# Patient Record
Sex: Female | Born: 1995 | Race: White | Hispanic: No | Marital: Single | Smoking: Never smoker
Health system: Southern US, Community
[De-identification: ages and names within clinical notes are randomized; demographics above are authoritative.]

## PROBLEM LIST (undated history)

## (undated) DIAGNOSIS — J45909 Unspecified asthma, uncomplicated: Secondary | ICD-10-CM

## (undated) DIAGNOSIS — F419 Anxiety disorder, unspecified: Secondary | ICD-10-CM

## (undated) HISTORY — PX: MOUTH SURGERY: SHX715

## (undated) HISTORY — PX: EYE SURGERY: SHX253

---
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
## 2014-05-05 ENCOUNTER — Ambulatory Visit (INDEPENDENT_AMBULATORY_CARE_PROVIDER_SITE_OTHER): Payer: Medicaid Other | Admitting: Advanced Practice Midwife

## 2014-05-05 VITALS — BP 106/71 | HR 92 | Temp 98.6°F | Wt 129.0 lb

## 2014-05-05 DIAGNOSIS — Z113 Encounter for screening for infections with a predominantly sexual mode of transmission: Secondary | ICD-10-CM

## 2014-05-05 NOTE — Addendum Note (Signed)
Addended by: Odessa FlemingBOHNE, KRISTINA M on: 05/05/2014 03:04 PM   Modules accepted: Orders

## 2014-05-05 NOTE — Progress Notes (Signed)
  Subjective:    Alison Stephenson is a 18 y.o. female who presents for sexually transmitted disease check. Sexual history reviewed with the patient. STI Exposure: denies knowledge of risky exposure and current sexual partner thought to have no history of any STD. New partner, now in a relationship with. Previous history of STI not mentioned. Current symptoms none. Contraception: IUD Menstrual History: OB History   Grav Para Term Preterm Abortions TAB SAB Ect Mult Living   0               Menarche age: [age]  No LMP recorded. Patient is not currently having periods (Reason: IUD).    The following portions of the patient's history were reviewed and updated as appropriate: allergies, current medications, past family history, past medical history, past social history, past surgical history and problem list.  Review of Systems A comprehensive review of systems was negative.    Objective:    BP 106/71  Pulse 92  Temp(Src) 98.6 F (37 C)  Wt 129 lb (58.514 kg) General:   alert and cooperative  Lymph Nodes:   Cervical, supraclavicular, and axillary nodes normal.  Pelvis:  Vulva and vagina appear normal. Bimanual exam reveals normal uterus and adnexa.  Cultures:  GC and Chlamydia genprobes     Assessment:    Very low risk of STD exposure  Mirena IUD in place.   Plan:    Discussed safe sexual practice in detail Orders Placed This Encounter  Procedures  . WET PREP BY MOLECULAR PROBE  . GC/Chlamydia Probe Amp      Amy Wilson SingerWren CNM

## 2014-05-06 LAB — WET PREP BY MOLECULAR PROBE
CANDIDA SPECIES: NEGATIVE
Gardnerella vaginalis: NEGATIVE
TRICHOMONAS VAG: NEGATIVE

## 2014-05-06 LAB — GC/CHLAMYDIA PROBE AMP
CT Probe RNA: NEGATIVE
GC Probe RNA: NEGATIVE

## 2014-05-06 LAB — RPR

## 2014-05-06 LAB — HEPATITIS B SURFACE ANTIGEN: Hepatitis B Surface Ag: NEGATIVE

## 2014-05-06 LAB — HEPATITIS C ANTIBODY: HCV AB: NEGATIVE

## 2014-05-06 LAB — HIV ANTIBODY (ROUTINE TESTING W REFLEX): HIV 1&2 Ab, 4th Generation: NONREACTIVE

## 2014-09-03 ENCOUNTER — Encounter: Payer: Self-pay | Admitting: Obstetrics

## 2014-09-03 ENCOUNTER — Ambulatory Visit (INDEPENDENT_AMBULATORY_CARE_PROVIDER_SITE_OTHER): Payer: Medicaid Other | Admitting: Obstetrics

## 2014-09-03 VITALS — BP 117/81 | HR 104 | Wt 123.0 lb

## 2014-09-03 DIAGNOSIS — B379 Candidiasis, unspecified: Secondary | ICD-10-CM

## 2014-09-03 MED ORDER — FLUCONAZOLE 150 MG PO TABS: 150.0000 mg | ORAL_TABLET | Freq: Once | ORAL | Status: DC

## 2014-09-03 NOTE — Progress Notes (Signed)
Patient ID: Mervyn SkeetersAutumn Stephenson, female   DOB: 09/08/1996, 89 y.o.   MRN: [id number]  Chief Complaint  Patient presents with  . Vaginitis    HPI Alison Stephenson is a 18 y.o. female.  Vaginal discharge and itching.  HPI  Past Medical History  Diagnosis Date  . Asthma   . Anxiety     seeing provider    Past Surgical History  Procedure Laterality Date  . 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 Arthritis Mother   . Mental illness Mother   . Cancer Maternal Grandmother   . Heart attack Paternal Grandmother     Social History History  Substance Use Topics  . Smoking status: Never Smoker   . Smokeless tobacco: Never Used  . Alcohol Use: No    Allergies  Allergen Reactions  . Latex Other (See Comments)    Irritation and redness    Current Outpatient Prescriptions  Medication Sig Dispense Refill  . albuterol (PROVENTIL HFA;VENTOLIN HFA) 108 (90 BASE) MCG/ACT inhaler Inhale 2 puffs into the lungs every 6 (six) hours as needed for wheezing.      . fluconazole (DIFLUCAN) 150 MG tablet Take 1 tablet (150 mg total) by mouth once.  1 tablet  2  . levonorgestrel (MIRENA) 20 MCG/24HR IUD 1 each by Intrauterine route once.       No current facility-administered medications for this visit.    Review of Systems Review of Systems Constitutional: negative for fatigue and weight loss Respiratory: negative for cough and wheezing Cardiovascular: negative for chest pain, fatigue and palpitations Gastrointestinal: negative for abdominal pain and change in bowel habits Genitourinary:negative Integument/breast: negative for nipple discharge Musculoskeletal:negative for myalgias Neurological: negative for gait problems and tremors Behavioral/Psych: negative for abusive relationship, depression Endocrine: negative for temperature intolerance     Blood pressure 117/81, pulse 104, weight 123 lb (55.792  kg).  Physical Exam Physical Exam General:   alert  Skin:   no rash or abnormalities  Lungs:   clear to auscultation bilaterally  Heart:   regular rate and rhythm, S1, S2 normal, no murmur, click, rub or gallop  Breasts:   normal without suspicious masses, skin or nipple changes or axillary nodes  Abdomen:  normal findings: no organomegaly, soft, non-tender and no hernia  Pelvis:  External genitalia: normal general appearance Urinary system: urethral meatus normal and bladder without fullness, nontender Vaginal: normal without tenderness, induration or masses Cervix: normal appearance Adnexa: normal bimanual exam Uterus: anteverted and non-tender, normal size      Data Reviewed Labs  Assessment    Candida vaginitis     Plan   Diflucan Rx  Orders Placed This Encounter  Procedures  . WET PREP BY MOLECULAR PROBE   Meds ordered this encounter  Medications  . fluconazole (DIFLUCAN) 150 MG tablet    Sig: Take 1 tablet (150 mg total) by mouth once.    Dispense:  1 tablet    Refill:  2       Ara Grandmaison A 09/03/2014, 12:46 PM

## 2014-09-04 LAB — WET PREP BY MOLECULAR PROBE
CANDIDA SPECIES: NEGATIVE
GARDNERELLA VAGINALIS: NEGATIVE
Trichomonas vaginosis: NEGATIVE

## 2014-10-19 ENCOUNTER — Ambulatory Visit: Payer: Medicaid Other | Admitting: Obstetrics

## 2014-10-19 ENCOUNTER — Encounter: Payer: Self-pay | Admitting: Obstetrics

## 2014-10-19 ENCOUNTER — Ambulatory Visit (INDEPENDENT_AMBULATORY_CARE_PROVIDER_SITE_OTHER): Payer: Medicaid Other | Admitting: Obstetrics

## 2014-10-19 VITALS — BP 113/71 | HR 78 | Temp 99.2°F | Ht 65.0 in | Wt 125.0 lb

## 2014-10-19 DIAGNOSIS — Z30432 Encounter for removal of intrauterine contraceptive device: Secondary | ICD-10-CM

## 2014-10-19 DIAGNOSIS — N760 Acute vaginitis: Secondary | ICD-10-CM

## 2014-10-19 DIAGNOSIS — Z30011 Encounter for initial prescription of contraceptive pills: Secondary | ICD-10-CM

## 2014-10-19 DIAGNOSIS — A499 Bacterial infection, unspecified: Secondary | ICD-10-CM

## 2014-10-19 DIAGNOSIS — Z01419 Encounter for gynecological examination (general) (routine) without abnormal findings: Secondary | ICD-10-CM

## 2014-10-19 DIAGNOSIS — Z0000 Encounter for general adult medical examination without abnormal findings: Secondary | ICD-10-CM

## 2014-10-19 DIAGNOSIS — B9689 Other specified bacterial agents as the cause of diseases classified elsewhere: Secondary | ICD-10-CM

## 2014-10-19 DIAGNOSIS — N72 Inflammatory disease of cervix uteri: Secondary | ICD-10-CM

## 2014-10-19 MED ORDER — CLINDAMYCIN HCL 300 MG PO CAPS: 300.0000 mg | ORAL_CAPSULE | Freq: Three times a day (TID) | ORAL | Status: DC

## 2014-10-19 MED ORDER — NORETHIN ACE-ETH ESTRAD-FE 1-20 MG-MCG(24) PO CHEW: 1.0000 | CHEWABLE_TABLET | Freq: Every day | ORAL | Status: DC

## 2014-10-20 ENCOUNTER — Encounter: Payer: Self-pay | Admitting: Obstetrics

## 2014-10-20 DIAGNOSIS — B9689 Other specified bacterial agents as the cause of diseases classified elsewhere: Secondary | ICD-10-CM | POA: Insufficient documentation

## 2014-10-20 DIAGNOSIS — N72 Inflammatory disease of cervix uteri: Secondary | ICD-10-CM | POA: Insufficient documentation

## 2014-10-20 DIAGNOSIS — N760 Acute vaginitis: Secondary | ICD-10-CM

## 2014-10-20 NOTE — Progress Notes (Signed)
Subjective:     Alison Stephenson is a 18 y.o. female here for a routine exam.  Current complaints: none.    Personal health questionnaire:  Is patient Ashkenazi Jewish, have a family history of breast and/or ovarian cancer: no Is there a family history of uterine cancer diagnosed at age < 1650, gastrointestinal cancer, urinary tract cancer, family member who is a Personnel officerLynch syndrome-associated carrier: no Is the patient overweight and hypertensive, family history of diabetes, personal history of gestational diabetes or PCOS: no Is patient over 7155, have PCOS,  family history of premature CHD under age 19, diabetes, smoke, have hypertension or peripheral artery disease:  no At any time, has a partner hit, kicked or otherwise hurt or frightened you?: no Over the past 2 weeks, have you felt down, depressed or hopeless?: no Over the past 2 weeks, have you felt little interest or pleasure in doing things?:no   Gynecologic History No LMP recorded. Patient is not currently having periods (Reason: IUD). Contraception: IUD Last Pap: n/a. Results were: n/a Last mammogram: n/a. Results were: n/a  Obstetric History OB History  Gravida Para Term Preterm AB SAB TAB Ectopic Multiple Living  0                 Past Medical History  Diagnosis Date  . Asthma   . Anxiety     seeing provider    Past Surgical History  Procedure Laterality Date  . 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 Mouth surgery      Current outpatient prescriptions: albuterol (PROVENTIL HFA;VENTOLIN HFA) 108 (90 BASE) MCG/ACT inhaler, Inhale 2 puffs into the lungs every 6 (six) hours as needed for wheezing., Disp: , Rfl: ;  levonorgestrel (MIRENA) 20 MCG/24HR IUD, 1 each by Intrauterine route once., Disp: , Rfl: ;  lisdexamfetamine (VYVANSE) 30 MG capsule, Take 30 mg by mouth daily., Disp: , Rfl:  clindamycin (CLEOCIN) 300 MG capsule, Take 1 capsule (300 mg total) by mouth 3 (three) times daily., Disp: 30  capsule, Rfl: 0;  Norethin Ace-Eth Estrad-FE (MINASTRIN 24 FE) 1-20 MG-MCG(24) CHEW, Chew 1 tablet by mouth at bedtime., Disp: 28 tablet, Rfl: 11 Allergies  Allergen Reactions  . Latex Other (See Comments)    Irritation and redness    History  Substance Use Topics  . Smoking status: Never Smoker   . Smokeless tobacco: Never Used  . Alcohol Use: No    Family History  Problem Relation Age of Onset  . Arthritis Mother   . Mental illness Mother   . Cancer Maternal Grandmother   . Heart attack Paternal Grandmother       Review of Systems  Constitutional: negative for fatigue and weight loss Respiratory: negative for cough and wheezing Cardiovascular: negative for chest pain, fatigue and palpitations Gastrointestinal: negative for abdominal pain and change in bowel habits Musculoskeletal:negative for myalgias Neurological: negative for gait problems and tremors Behavioral/Psych: negative for abusive relationship, depression Endocrine: negative for temperature intolerance   Genitourinary:negative for abnormal menstrual periods, genital lesions, hot flashes, sexual problems and vaginal discharge Integument/breast: negative for breast lump, breast tenderness, nipple discharge and skin lesion(s)    Objective:       BP 113/71 mmHg  Pulse 78  Temp(Src) 99.2 F (37.3 C)  Ht 5\' 5"  (1.651 m)  Wt 125 lb (56.7 kg)  BMI 20.80 kg/m2 General:   alert  Skin:   no rash or abnormalities  Lungs:  clear to auscultation bilaterally  Heart:   regular rate and rhythm, S1, S2 normal, no murmur, click, rub or gallop  Breasts:   normal without suspicious masses, skin or nipple changes or axillary nodes  Abdomen:  normal findings: no organomegaly, soft, non-tender and no hernia  Pelvis:  External genitalia: normal general appearance Urinary system: urethral meatus normal and bladder without fullness, nontender Vaginal: normal without tenderness, induration or masses Cervix: normal appearance.   IUD string grasped with a ring forcep and removed, intact. Adnexa: normal bimanual exam Uterus: anteverted and non-tender, normal size   Lab Review Urine pregnancy test Labs reviewed yes Radiologic studies reviewed no    Assessment:    Healthy female exam.    Severe Dysmenorrhea with IUD  BV  Contraceptive counseling.  Wanted IUD removed.  Desires OCP's   Plan:   IUD Removed Flagyl Rx Minastrin 24 Rx F/U 4 months     Meds ordered this encounter  Medications  . lisdexamfetamine (VYVANSE) 30 MG capsule    Sig: Take 30 mg by mouth daily.  . Norethin Ace-Eth Estrad-FE (MINASTRIN 24 FE) 1-20 MG-MCG(24) CHEW    Sig: Chew 1 tablet by mouth at bedtime.    Dispense:  28 tablet    Refill:  11  . clindamycin (CLEOCIN) 300 MG capsule    Sig: Take 1 capsule (300 mg total) by mouth 3 (three) times daily.    Dispense:  30 capsule    Refill:  0   Orders Placed This Encounter  Procedures  . SureSwab, Vaginosis/Vaginitis Plus

## 2014-10-27 ENCOUNTER — Telehealth: Payer: Self-pay | Admitting: *Deleted

## 2014-10-27 NOTE — Telephone Encounter (Signed)
Patient is concerned about the side effects of OCP.  4:48 Spoke with patient- she is concerned about side effects- acne and mood swings with the Minastrin. Patient reassured that she may have none and if she does, it can be changed with a call. Patient will try and see hoe she does.

## 2015-02-18 ENCOUNTER — Encounter: Payer: Self-pay | Admitting: Obstetrics

## 2015-02-18 ENCOUNTER — Ambulatory Visit (INDEPENDENT_AMBULATORY_CARE_PROVIDER_SITE_OTHER): Payer: Medicaid Other | Admitting: Obstetrics

## 2015-02-18 VITALS — BP 123/68 | HR 99 | Temp 98.1°F | Wt 124.0 lb

## 2015-02-18 DIAGNOSIS — Z3041 Encounter for surveillance of contraceptive pills: Secondary | ICD-10-CM | POA: Diagnosis not present

## 2015-02-18 MED ORDER — NORETHIN-ETH ESTRAD-FE BIPHAS 1 MG-10 MCG / 10 MCG PO TABS: 1.0000 | ORAL_TABLET | Freq: Every day | ORAL | Status: DC

## 2015-02-18 NOTE — Progress Notes (Signed)
Subjective:    Alison Stephenson is a 19 y.o. female who presents for contraception counseling. The patient was taking Minastrin and did not like the way it made her feel.  Feels better after stopping Minastrin.  The patient is sexually active. Pertinent past medical history: none.  The information documented in the HPI was reviewed and verified.  Menstrual History: OB History    Gravida Para Term Preterm AB TAB SAB Ectopic Multiple Living   0               Menarche age: not asked                                                        Patient's last menstrual period was 02/04/2015 (approximate).   Patient Active Problem List   Diagnosis Date Noted  . BV (bacterial vaginosis) 10/20/2014  . Chronic cervicitis 10/20/2014  . Yeast infection 12/23/2013  . Vaginitis and vulvovaginitis, unspecified 08/20/2013  . Vulvar irritation 08/20/2013  . Well woman exam with routine gynecological exam 06/24/2013  . IUD (intrauterine device) in place 06/24/2013  . Vaginitis 06/24/2013   Past Medical History  Diagnosis Date  . Asthma   . Anxiety     seeing provider    Past Surgical History  Procedure Laterality Date  . 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 Mouth surgery       Current outpatient prescriptions:  .  albuterol (PROVENTIL HFA;VENTOLIN HFA) 108 (90 BASE) MCG/ACT inhaler, Inhale 2 puffs into the lungs every 6 (six) hours as needed for wheezing., Disp: , Rfl:  .  lisdexamfetamine (VYVANSE) 30 MG capsule, Take 30 mg by mouth daily., Disp: , Rfl:  .  clindamycin (CLEOCIN) 300 MG capsule, Take 1 capsule (300 mg total) by mouth 3 (three) times daily. (Patient not taking: Reported on 02/18/2015), Disp: 30 capsule, Rfl: 0 .  levonorgestrel (MIRENA) 20 MCG/24HR IUD, 1 each by Intrauterine route once., Disp: , Rfl:  .  Norethin Ace-Eth Estrad-FE (MINASTRIN 24 FE) 1-20 MG-MCG(24) CHEW, Chew 1 tablet by mouth at bedtime. (Patient not taking: Reported on 02/18/2015), Disp:  28 tablet, Rfl: 11 .  Norethindrone-Ethinyl Estradiol-Fe Biphas (LO LOESTRIN FE) 1 MG-10 MCG / 10 MCG tablet, Take 1 tablet by mouth daily., Disp: 1 Package, Rfl: 11 Allergies  Allergen Reactions  . Latex Other (See Comments)    Irritation and redness    History  Substance Use Topics  . Smoking status: Never Smoker   . Smokeless tobacco: Never Used  . Alcohol Use: No    Family History  Problem Relation Age of Onset  . Arthritis Mother   . Mental illness Mother   . Cancer Maternal Grandmother   . Heart attack Paternal Grandmother        Review of Systems Constitutional: negative for weight loss Genitourinary:negative for abnormal menstrual periods and vaginal discharge  Objective:   BP 123/68 mmHg  Pulse 99  Temp(Src) 98.1 F (36.7 C)  Wt 124 lb (56.246 kg)  LMP 02/04/2015 (Approximate)  PE:  Deferred   Lab Review Urine pregnancy test Labs reviewed yes Radiologic studies reviewed no  100% of 15 min visit spent on counseling and coordination of care.   Assessment:    19 y.o., continuing with Lo  Loestrin 10 ( stop Minastrin ), no contraindications.   Plan:    All questions answered.    Meds ordered this encounter  Medications  . Norethindrone-Ethinyl Estradiol-Fe Biphas (LO LOESTRIN FE) 1 MG-10 MCG / 10 MCG tablet    Sig: Take 1 tablet by mouth daily.    Dispense:  1 Package    Refill:  11   No orders of the defined types were placed in this encounter.

## 2015-05-20 ENCOUNTER — Ambulatory Visit: Payer: Medicaid Other | Admitting: Obstetrics

## 2015-05-21 ENCOUNTER — Ambulatory Visit (INDEPENDENT_AMBULATORY_CARE_PROVIDER_SITE_OTHER): Payer: Medicaid Other | Admitting: Obstetrics

## 2015-05-21 ENCOUNTER — Encounter: Payer: Self-pay | Admitting: Obstetrics

## 2015-05-21 VITALS — BP 114/75 | HR 87 | Temp 97.4°F | Ht 65.0 in | Wt 129.2 lb

## 2015-05-21 DIAGNOSIS — Z3041 Encounter for surveillance of contraceptive pills: Secondary | ICD-10-CM

## 2015-05-21 DIAGNOSIS — Z3040 Encounter for surveillance of contraceptives, unspecified: Secondary | ICD-10-CM

## 2015-05-21 DIAGNOSIS — Z113 Encounter for screening for infections with a predominantly sexual mode of transmission: Secondary | ICD-10-CM

## 2015-05-21 MED ORDER — NORETHIN ACE-ETH ESTRAD-FE 1.5-30 MG-MCG PO TABS: 1.0000 | ORAL_TABLET | Freq: Every day | ORAL | Status: DC

## 2015-05-21 NOTE — Progress Notes (Signed)
Subjective:    Alison Stephenson is a 19 y.o. female who presents for contraception counseling. The patient has vaginal bleeding midcycle. The patient is sexually active. Pertinent past medical history: none.  The information documented in the HPI was reviewed and verified.  Menstrual History: OB History    Gravida Para Term Preterm AB TAB SAB Ectopic Multiple Living   0                Patient's last menstrual period was 04/26/2015 (approximate).   Patient Active Problem List   Diagnosis Date Noted  . BV (bacterial vaginosis) 10/20/2014  . Chronic cervicitis 10/20/2014  . Yeast infection 12/23/2013  . Vaginitis and vulvovaginitis, unspecified 08/20/2013  . Vulvar irritation 08/20/2013  . Well woman exam with routine gynecological exam 06/24/2013  . IUD (intrauterine device) in place 06/24/2013  . Vaginitis 06/24/2013   Past Medical History  Diagnosis Date  . Asthma   . Anxiety     seeing provider    Past Surgical History  Procedure Laterality Date  . 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 Mouth surgery       Current outpatient prescriptions:  .  albuterol (PROVENTIL HFA;VENTOLIN HFA) 108 (90 BASE) MCG/ACT inhaler, Inhale 2 puffs into the lungs every 6 (six) hours as needed for wheezing., Disp: , Rfl:  .  lisdexamfetamine (VYVANSE) 30 MG capsule, Take 30 mg by mouth daily., Disp: , Rfl:  .  Norethindrone-Ethinyl Estradiol-Fe Biphas (LO LOESTRIN FE) 1 MG-10 MCG / 10 MCG tablet, Take 1 tablet by mouth daily., Disp: 1 Package, Rfl: 11 .  clindamycin (CLEOCIN) 300 MG capsule, Take 1 capsule (300 mg total) by mouth 3 (three) times daily. (Patient not taking: Reported on 02/18/2015), Disp: 30 capsule, Rfl: 0 .  levonorgestrel (MIRENA) 20 MCG/24HR IUD, 1 each by Intrauterine route once., Disp: , Rfl:  .  norethindrone-ethinyl estradiol-iron (MICROGESTIN FE,GILDESS FE,LOESTRIN FE) 1.5-30 MG-MCG tablet, Take 1 tablet by mouth daily., Disp: 1 Package, Rfl:  11 Allergies  Allergen Reactions  . Latex Other (See Comments)    Irritation and redness    History  Substance Use Topics  . Smoking status: Never Smoker   . Smokeless tobacco: Never Used  . Alcohol Use: No    Family History  Problem Relation Age of Onset  . Arthritis Mother   . Mental illness Mother   . Cancer Maternal Grandmother   . Heart attack Paternal Grandmother        Review of Systems Constitutional: negative for weight loss Genitourinary:negative for abnormal menstrual periods and vaginal discharge   Objective:   BP 114/75 mmHg  Pulse 87  Temp(Src) 97.4 F (36.3 C)  Ht 5\' 5"  (1.651 m)  Wt 129 lb 3.2 oz (58.605 kg)  BMI 21.50 kg/m2  LMP 04/26/2015 (Approximate)  PE:  Deferred   Lab Review Urine pregnancy test Labs reviewed yes Radiologic studies reviewed no  100% of 10 min visit spent on counseling and coordination of care.   Assessment:    19 y.o., continuing OCP (estrogen/progesterone), no contraindications.   Plan:   E2 dose in Lo Loestrin not adequate for full cycle. Needs higher E2 dose pill for cycle control. Rationale for higher strength OCP explained to patient.  All questions answered. Contraception: OCP (estrogen/progesterone). Diagnosis explained in detail, including differential. Discussed healthy lifestyle modifications. Agricultural engineerducational material distributed. Stop Lo Loestrin 24.  Start Loestrin 1.5 / 30 Fe.   F/U in  4 months  Meds ordered this encounter  Medications  . norethindrone-ethinyl estradiol-iron (MICROGESTIN FE,GILDESS FE,LOESTRIN FE) 1.5-30 MG-MCG tablet    Sig: Take 1 tablet by mouth daily.    Dispense:  1 Package    Refill:  11   Orders Placed This Encounter  Procedures  . GC/Chlamydia Probe Amp

## 2015-05-22 ENCOUNTER — Emergency Department (HOSPITAL_COMMUNITY)
Admission: EM | Admit: 2015-05-22 | Discharge: 2015-05-22 | Disposition: A | Discharge status: Home / Self Care | Payer: Medicaid Other | Attending: Emergency Medicine | Admitting: Emergency Medicine

## 2015-05-22 ENCOUNTER — Encounter (HOSPITAL_COMMUNITY): Payer: Self-pay | Admitting: *Deleted

## 2015-05-22 DIAGNOSIS — Z79818 Long term (current) use of other agents affecting estrogen receptors and estrogen levels: Secondary | ICD-10-CM | POA: Diagnosis not present

## 2015-05-22 DIAGNOSIS — J45909 Unspecified asthma, uncomplicated: Secondary | ICD-10-CM | POA: Diagnosis not present

## 2015-05-22 DIAGNOSIS — Z79899 Other long term (current) drug therapy: Secondary | ICD-10-CM | POA: Insufficient documentation

## 2015-05-22 DIAGNOSIS — Z8659 Personal history of other mental and behavioral disorders: Secondary | ICD-10-CM | POA: Insufficient documentation

## 2015-05-22 DIAGNOSIS — J029 Acute pharyngitis, unspecified: Secondary | ICD-10-CM | POA: Diagnosis not present

## 2015-05-22 DIAGNOSIS — Z91040 Latex allergy status: Secondary | ICD-10-CM | POA: Diagnosis not present

## 2015-05-22 DIAGNOSIS — R0981 Nasal congestion: Secondary | ICD-10-CM

## 2015-05-22 LAB — GC/CHLAMYDIA PROBE AMP
CT PROBE, AMP APTIMA: NEGATIVE
GC PROBE AMP APTIMA: NEGATIVE

## 2015-05-22 LAB — RAPID STREP SCREEN (MED CTR MEBANE ONLY): Streptococcus, Group A Screen (Direct): NEGATIVE

## 2015-05-22 MED ORDER — ONDANSETRON 4 MG PO TBDP: 4.0000 mg | ORAL_TABLET | Freq: Three times a day (TID) | ORAL | Status: DC | PRN

## 2015-05-22 MED ORDER — HYDROCODONE-ACETAMINOPHEN 7.5-325 MG/15ML PO SOLN: 15.0000 mL | Freq: Three times a day (TID) | ORAL | Status: DC | PRN

## 2015-05-22 NOTE — ED Notes (Signed)
Pt escorted to discharge window. Pt verbalized understanding discharge instructions. In no acute distress.  

## 2015-05-22 NOTE — ED Notes (Signed)
Pt reports general malaise x1 week. But today increased saliva production, sore throat x3, headache, nausea, and burping. Pain 3/10 at present.

## 2015-05-22 NOTE — ED Provider Notes (Signed)
CSN: [id number]     Arrival date & time 05/22/15  1125 History   First MD Initiated Contact with Patient 05/22/15 1144     Chief Complaint  Patient presents with  . Sore Throat     (Consider location/radiation/quality/duration/timing/severity/associated sxs/prior Treatment) Patient is a 19 y.o. female presenting with pharyngitis. The history is provided by the patient and medical records.  Sore Throat Associated symptoms include a sore throat.   This is a 19 year old female with past medical history significant for asthma and anxiety, presenting to the ED for generally feeling unwell for the past week. States today she awoke with sore throat, mild headache, nausea, nasal congestion, and increased belching. No vomiting or diarrhea.  Patient does have known deviated septum, chronic issues with nasal congestion.  She states she feels like she has increased saliva production, but no difficulty handling her secretions. No difficulty swallowing. No cough, shortness of breath, or chest pain. Patient denies any known sick contacts. No fever or chills. No intervention tried prior to arrival.  Past Medical History  Diagnosis Date  . Asthma   . Anxiety     seeing provider   Past Surgical History  Procedure Laterality Date  . 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 Mouth surgery     Family History  Problem Relation Age of Onset  . Arthritis Mother   . Mental illness Mother   . Cancer Maternal Grandmother   . Heart attack Paternal Grandmother    History  Substance Use Topics  . Smoking status: Never Smoker   . Smokeless tobacco: Never Used  . Alcohol Use: No   OB History    Gravida Para Term Preterm AB TAB SAB Ectopic Multiple Living   0              Review of Systems  HENT: Positive for sore throat.   All other systems reviewed and are negative.     Allergies  Latex  Home Medications   Prior to Admission medications   Medication Sig Start Date  End Date Taking? Authorizing Provider  albuterol (PROVENTIL HFA;VENTOLIN HFA) 108 (90 BASE) MCG/ACT inhaler Inhale 2 puffs into the lungs every 6 (six) hours as needed for wheezing.    Historical Provider, MD  clindamycin (CLEOCIN) 300 MG capsule Take 1 capsule (300 mg total) by mouth 3 (three) times daily. Patient not taking: Reported on 02/18/2015 10/19/14   Brock Badharles A Harper, MD  levonorgestrel [facility](MIRENA) 20 MCG/24HR IUD 1 each by Intrauterine route once.    Historical Provider, MD  lisdexamfetamine (VYVANSE) 30 MG capsule Take 30 mg by mouth daily.    Historical Provider, MD  Norethindrone-Ethinyl Estradiol-Fe Biphas (LO LOESTRIN FE) 1 MG-10 MCG / 10 MCG tablet Take 1 tablet by mouth daily. 02/18/15   Brock Badharles A Harper, MD  norethindrone-ethinyl estradiol-iron (MICROGESTIN FE,GILDESS FE,LOESTRIN FE) 1.5-30 MG-MCG tablet Take 1 tablet by mouth daily. 05/21/15   Brock Badharles A Harper, MD   BP 115/81 mmHg  Pulse 91  Temp(Src) 98.7 F (37.1 C) (Oral)  Resp 18  SpO2 100%  LMP 04/26/2015 (Approximate)   Physical Exam  Constitutional: She is oriented to person, place, and time. She appears well-developed and well-nourished.  HENT:  Head: Normocephalic and atraumatic.  Right Ear: Tympanic membrane and ear canal normal.  Left Ear: Tympanic membrane and ear canal normal.  Nose: Nose normal.  Mouth/Throat: Uvula is midline and mucous membranes are normal. No oral  lesions. No trismus in the jaw. Posterior oropharyngeal erythema present. No oropharyngeal exudate or posterior oropharyngeal edema.  Tonsils 1+ bilaterally without exudate; uvula midline without peritonsillar abscess; handling secretions appropriately; no difficulty swallowing or speaking; normal phonation, no stridor  Eyes: Conjunctivae and EOM are normal. Pupils are equal, round, and reactive to light.  Neck: Normal range of motion.  Cardiovascular: Normal rate, regular rhythm and normal heart sounds.   Pulmonary/Chest: Effort normal and breath  sounds normal. No respiratory distress. She has no wheezes.  Abdominal: Soft. Bowel sounds are normal.  Musculoskeletal: Normal range of motion.  Neurological: She is alert and oriented to person, place, and time.  Skin: Skin is warm and dry.  Psychiatric: She has a normal mood and affect.  Nursing note and vitals reviewed.   ED Course  Procedures (including critical care time) Labs Review Labs Reviewed  RAPID STREP SCREEN (NOT AT [facility])    Imaging Review No results found.   EKG Interpretation None      MDM   Final diagnoses:  Sore throat  Nasal congestion   19 year old female here with URI type symptoms for the past week. Patient afebrile, nontoxic. She does have some tonsillar enlargement without exudates. Uvula remains midline without evidence of peritonsillar abscess. She is handling secretions well. Exam overall noninfectious. Rapid strep was sent and is negative, culture pending. Suspect viral etiology of symptoms. Will discharge home with supportive care. She was given referral to ENT regarding her deviated septum.  Discussed plan with patient, he/she acknowledged understanding and agreed with plan of care.  Return precautions given for new or worsening symptoms.  Garlon Hatchet, PA-C 05/22/15 1220  Benjiman Core, MD 05/23/15 [phone]

## 2015-05-22 NOTE — Discharge Instructions (Signed)
Take the prescribed medication as directed.  Throat culture was sent, you will be notified if abnormal. Follow-up with ENT if desired regarding your deviated septum. Return to the ED for new or worsening symptoms.

## 2015-05-22 NOTE — ED Notes (Signed)
Pt alert and oriented x4. Respirations even and unlabored, bilateral symmetrical rise and fall of chest. Skin warm and dry. In no acute distress. Denies needs.   

## 2015-05-26 LAB — CULTURE, GROUP A STREP

## 2015-06-23 ENCOUNTER — Ambulatory Visit (INDEPENDENT_AMBULATORY_CARE_PROVIDER_SITE_OTHER): Payer: Medicaid Other | Admitting: Obstetrics

## 2015-06-23 ENCOUNTER — Encounter: Payer: Self-pay | Admitting: Obstetrics

## 2015-06-23 VITALS — BP 105/67 | HR 79 | Temp 98.3°F | Wt 128.0 lb

## 2015-06-23 DIAGNOSIS — N910 Primary amenorrhea: Secondary | ICD-10-CM | POA: Diagnosis not present

## 2015-06-23 DIAGNOSIS — Z309 Encounter for contraceptive management, unspecified: Secondary | ICD-10-CM

## 2015-06-23 LAB — POCT URINE PREGNANCY: Preg Test, Ur: NEGATIVE

## 2015-06-23 MED ORDER — MEDROXYPROGESTERONE ACETATE 10 MG PO TABS: 10.0000 mg | ORAL_TABLET | Freq: Every day | ORAL | Status: DC

## 2015-06-24 NOTE — Progress Notes (Signed)
Patient ID: Alison Stephenson, female   DOB: 1995/11/26, 19 y.o.   MRN: [id number]  Chief Complaint  Patient presents with  . Menstrual Problem    changes in cycle with birth control     HPI Alison Stephenson is a 19 y.o. female.  No period after stopping OCP's.  HPI  Past Medical History  Diagnosis Date  . Asthma   . Anxiety     seeing provider    Past Surgical History  Procedure Laterality Date  . 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 Mouth surgery      Family History  Problem Relation Age of Onset  . Arthritis Mother   . Mental illness Mother   . Cancer Maternal Grandmother   . Heart attack Paternal Grandmother     Social History Social History  Substance Use Topics  . Smoking status: Never Smoker   . Smokeless tobacco: Never Used  . Alcohol Use: No    Allergies  Allergen Reactions  . Latex Other (See Comments)    Irritation and redness    Current Outpatient Prescriptions  Medication Sig Dispense Refill  . albuterol (PROVENTIL HFA;VENTOLIN HFA) 108 (90 BASE) MCG/ACT inhaler Inhale 2 puffs into the lungs every 6 (six) hours as needed for wheezing.    Marland Kitchen lisdexamfetamine (VYVANSE) 30 MG capsule Take 30 mg by mouth daily.    . Norethindrone-Ethinyl Estradiol-Fe Biphas (LO LOESTRIN FE) 1 MG-10 MCG / 10 MCG tablet Take 1 tablet by mouth daily. 1 Package 11  . clindamycin (CLEOCIN) 300 MG capsule Take 1 capsule (300 mg total) by mouth 3 (three) times daily. (Patient not taking: Reported on 02/18/2015) 30 capsule 0  . HYDROcodone-acetaminophen (HYCET) 7.5-325 mg/15 ml solution Take 15 mLs by mouth every 8 (eight) hours as needed for moderate pain. (Patient not taking: Reported on 06/23/2015) 120 mL 0  . levonorgestrel (MIRENA) 20 MCG/24HR IUD 1 each by Intrauterine route once.    . medroxyPROGESTERone (PROVERA) 10 MG tablet Take 1 tablet (10 mg total) by mouth daily. 10 tablet 0  . norethindrone-ethinyl estradiol-iron (MICROGESTIN FE,GILDESS  FE,LOESTRIN FE) 1.5-30 MG-MCG tablet Take 1 tablet by mouth daily. (Patient not taking: Reported on 06/23/2015) 1 Package 11  . ondansetron (ZOFRAN ODT) 4 MG disintegrating tablet Take 1 tablet (4 mg total) by mouth every 8 (eight) hours as needed for nausea. 10 tablet 0   No current facility-administered medications for this visit.    Review of Systems Review of Systems Constitutional: negative for fatigue and weight loss Respiratory: negative for cough and wheezing Cardiovascular: negative for chest pain, fatigue and palpitations Gastrointestinal: negative for abdominal pain and change in bowel habits Genitourinary: positive for amenorrhea Integument/breast: negative for nipple discharge Musculoskeletal:negative for myalgias Neurological: negative for gait problems and tremors Behavioral/Psych: negative for abusive relationship, depression Endocrine: negative for temperature intolerance     Blood pressure 105/67, pulse 79, temperature 98.3 F (36.8 C), weight 128 lb (58.06 kg).  Physical Exam Physical Exam            General:  Alert and no distress Abdomen:  normal findings: no organomegaly, soft, non-tender and no hernia  Pelvis:  External genitalia: normal general appearance Urinary system: urethral meatus normal and bladder without fullness, nontender Vaginal: normal without tenderness, induration or masses Cervix: normal appearance Adnexa: normal bimanual exam Uterus: anteverted and non-tender, normal size     Data Reviewed Urine pregnancy test  Assessment  Amenorrhea, physiologic     Plan    Provera withdrawal  Continue OCP's with period  Orders Placed This Encounter  Procedures  . POCT urine pregnancy   Meds ordered this encounter  Medications  . medroxyPROGESTERone (PROVERA) 10 MG tablet    Sig: Take 1 tablet (10 mg total) by mouth daily.    Dispense:  10 tablet    Refill:  0

## 2015-09-21 ENCOUNTER — Ambulatory Visit: Payer: Medicaid Other | Admitting: Obstetrics

## 2015-10-13 ENCOUNTER — Emergency Department (HOSPITAL_COMMUNITY): Payer: Medicaid Other

## 2015-10-13 ENCOUNTER — Encounter (HOSPITAL_COMMUNITY): Payer: Self-pay

## 2015-10-13 ENCOUNTER — Emergency Department (HOSPITAL_COMMUNITY)
Admission: EM | Admit: 2015-10-13 | Discharge: 2015-10-13 | Disposition: A | Discharge status: Home / Self Care | Payer: Medicaid Other | Attending: Emergency Medicine | Admitting: Emergency Medicine

## 2015-10-13 DIAGNOSIS — G44319 Acute post-traumatic headache, not intractable: Secondary | ICD-10-CM

## 2015-10-13 DIAGNOSIS — Y9372 Activity, wrestling: Secondary | ICD-10-CM | POA: Diagnosis not present

## 2015-10-13 DIAGNOSIS — Z91040 Latex allergy status: Secondary | ICD-10-CM | POA: Insufficient documentation

## 2015-10-13 DIAGNOSIS — J45909 Unspecified asthma, uncomplicated: Secondary | ICD-10-CM | POA: Insufficient documentation

## 2015-10-13 DIAGNOSIS — Z79899 Other long term (current) drug therapy: Secondary | ICD-10-CM | POA: Insufficient documentation

## 2015-10-13 DIAGNOSIS — R110 Nausea: Secondary | ICD-10-CM

## 2015-10-13 DIAGNOSIS — Y998 Other external cause status: Secondary | ICD-10-CM | POA: Diagnosis not present

## 2015-10-13 DIAGNOSIS — Z8659 Personal history of other mental and behavioral disorders: Secondary | ICD-10-CM | POA: Diagnosis not present

## 2015-10-13 DIAGNOSIS — S060X0A Concussion without loss of consciousness, initial encounter: Secondary | ICD-10-CM | POA: Diagnosis not present

## 2015-10-13 DIAGNOSIS — Y9289 Other specified places as the place of occurrence of the external cause: Secondary | ICD-10-CM | POA: Insufficient documentation

## 2015-10-13 DIAGNOSIS — W500XXA Accidental hit or strike by another person, initial encounter: Secondary | ICD-10-CM | POA: Insufficient documentation

## 2015-10-13 DIAGNOSIS — S0990XA Unspecified injury of head, initial encounter: Secondary | ICD-10-CM | POA: Diagnosis present

## 2015-10-13 DIAGNOSIS — Z793 Long term (current) use of hormonal contraceptives: Secondary | ICD-10-CM | POA: Diagnosis not present

## 2015-10-13 MED ORDER — HYDROCODONE-ACETAMINOPHEN 5-325 MG PO TABS
1.0000 | ORAL_TABLET | Freq: Once | ORAL | Status: DC
  Filled 2015-10-13: qty 1

## 2015-10-13 MED ORDER — SODIUM CHLORIDE 0.9 % IV BOLUS (SEPSIS): 1000.0000 mL | Freq: Once | INTRAVENOUS | Status: DC

## 2015-10-13 MED ORDER — ONDANSETRON 8 MG PO TBDP
8.0000 mg | ORAL_TABLET | Freq: Once | ORAL | Status: DC
  Filled 2015-10-13: qty 1

## 2015-10-13 MED ORDER — ONDANSETRON HCL 4 MG/2ML IJ SOLN
4.0000 mg | Freq: Once | INTRAMUSCULAR | Status: DC
  Filled 2015-10-13: qty 2

## 2015-10-13 MED ORDER — MORPHINE SULFATE (PF) 2 MG/ML IV SOLN
2.0000 mg | Freq: Once | INTRAVENOUS | Status: DC
Start: 2015-10-13 — End: 2015-10-13
  Filled 2015-10-13: qty 1

## 2015-10-13 MED ORDER — NAPROXEN 500 MG PO TABS: 500.0000 mg | ORAL_TABLET | Freq: Two times a day (BID) | ORAL | Status: DC | PRN

## 2015-10-13 MED ORDER — ONDANSETRON HCL 8 MG PO TABS: 8.0000 mg | ORAL_TABLET | Freq: Three times a day (TID) | ORAL | Status: DC | PRN

## 2015-10-13 NOTE — ED Provider Notes (Signed)
CSN: [id number]     Arrival date & time 10/13/15  1348 History  By signing my name below, I, [name], attest that this documentation has been prepared under the direction and in the presence of Tajanay Hurley Camprubi-Soms, PA-C. Electronically Signed: [name], ED Scribe. 10/13/2015. 4:48 PM.   Chief Complaint  Patient presents with  . Headache   Patient is a 19 y.o. female presenting with headaches. The history is provided by the patient. No language interpreter was used.  Headache Pain location:  L temporal Quality: pressure. Radiates to:  Does not radiate Severity currently:  3/10 Severity at highest:  3/10 Onset quality:  Sudden Duration:  1 day Timing:  Constant Progression:  Unchanged Chronicity:  New Context comment:  Hit head on a friend's head Relieved by:  None tried Worsened by:  Nothing Ineffective treatments:  None tried Associated symptoms: nausea and photophobia   Associated symptoms: no abdominal pain, no blurred vision, no diarrhea, no dizziness, no fever, no myalgias, no neck pain, no numbness, no tingling, no visual change, no vomiting and no weakness     HPI Comments: Alison Stephenson is a 19 y.o. female with a PMHx of anxiety, who presents to the Emergency Department complaining of 3/10, constant, nonradiating "pressure like" temporal HA with onset 1 day ago, worse with lights, and with no tx tried PTA. Pt notes having been seen at an UC for a possible concussion after accidentally hitting heads with her boyfriend yesterday around 4pm and was sent to the ED for reevaluation/scans. Pt notes that during the accident she was struck against her anterior scalp further noting some associated swelling to her left temporal region which is the cause of her concern. She describes her pain as a "pressure behind her left eye/temple" further noting associated photophobia and nausea. Pt denies taking any medications for pain management PTA. She denies being on any blood  thinning medications. Pt is currently on her normal menstrual cycle. She confirms her listed allergies. She denies lightheadedness, dizziness, LOC, visual disturbance, diplopia, fevers, chills, CP, SOB, abd pain, N/V/D/C, hematuria, dysuria, myalgias, arthralgias, numbness, tingling and weakness. No bruising or abrasions.   Past Medical History  Diagnosis Date  . Asthma   . Anxiety     seeing provider   Past Surgical History  Procedure Laterality Date  . 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 Mouth surgery     Family History  Problem Relation Age of Onset  . Arthritis Mother   . Mental illness Mother   . Cancer Maternal Grandmother   . Heart attack Paternal Grandmother    Social History  Substance Use Topics  . Smoking status: Never Smoker   . Smokeless tobacco: Never Used  . Alcohol Use: No   OB History    Gravida Para Term Preterm AB TAB SAB Ectopic Multiple Living   0              Review of Systems  Constitutional: Negative for fever and chills.  Eyes: Positive for photophobia. Negative for blurred vision and visual disturbance.  Respiratory: Negative for shortness of breath.   Cardiovascular: Negative for chest pain.  Gastrointestinal: Positive for nausea. Negative for vomiting, abdominal pain, diarrhea and constipation.  Genitourinary: Negative for dysuria, hematuria, vaginal bleeding and vaginal discharge.  Musculoskeletal: Negative for myalgias, arthralgias and neck pain.  Skin: Negative for color change and wound.  Allergic/Immunologic: Negative for immunocompromised state.  Neurological:  Positive for headaches. Negative for dizziness, syncope, weakness, light-headedness and numbness.  Psychiatric/Behavioral: Negative for confusion.  A complete 10 system review of systems was obtained and all systems are negative except as noted in the HPI and PMH.    Allergies  Latex  Home Medications   Prior to Admission medications    Medication Sig Start Date End Date Taking? Authorizing Provider  albuterol (PROVENTIL HFA;VENTOLIN HFA) 108 (90 BASE) MCG/ACT inhaler Inhale 2 puffs into the lungs every 6 (six) hours as needed for wheezing.   Yes Historical Provider, MD  Biotin 1 MG CAPS Take 1 mg by mouth daily.   Yes Historical Provider, MD  lisdexamfetamine (VYVANSE) 20 MG capsule Take 20 mg by mouth daily as needed (ADD).  08/18/15  Yes Historical Provider, MD  Multiple Vitamins-Minerals (MULTIVITAMIN ADULT PO) Take 1 tablet by mouth daily.   Yes Historical Provider, MD  omega-3 acid ethyl esters (LOVAZA) 1 G capsule Take 1 g by mouth daily.   Yes Historical Provider, MD  vitamin C (ASCORBIC ACID) 500 MG tablet Take 500 mg by mouth daily.   Yes Historical Provider, MD  HYDROcodone-acetaminophen (HYCET) 7.5-325 mg/15 ml solution Take 15 mLs by mouth every 8 (eight) hours as needed for moderate pain. Patient not taking: Reported on 06/23/2015 05/22/15   Garlon Hatchet, PA-C  medroxyPROGESTERone (PROVERA) 10 MG tablet Take 1 tablet (10 mg total) by mouth daily. Patient not taking: Reported on 10/13/2015 06/23/15   Brock Bad, MD  Norethindrone-Ethinyl Estradiol-Fe Biphas (LO LOESTRIN FE) 1 MG-10 MCG / 10 MCG tablet Take 1 tablet by mouth daily. Patient not taking: Reported on 10/13/2015 02/18/15   Brock Bad, MD  norethindrone-ethinyl estradiol-iron (MICROGESTIN FE,GILDESS FE,LOESTRIN FE) 1.5-30 MG-MCG tablet Take 1 tablet by mouth daily. Patient not taking: Reported on 06/23/2015 05/21/15   Brock Bad, MD  ondansetron (ZOFRAN ODT) 4 MG disintegrating tablet Take 1 tablet (4 mg total) by mouth every 8 (eight) hours as needed for nausea. Patient not taking: Reported on 10/13/2015 05/22/15   Garlon Hatchet, PA-C   BP 119/85 mmHg  Pulse 90  Temp(Src) 98.2 F (36.8 C) (Oral)  Resp 18  SpO2 100%  LMP 10/08/2015 (Exact Date) Physical Exam  Constitutional: She is oriented to person, place, and time. Vital signs are  normal. She appears well-developed and well-nourished.  Non-toxic appearance. No distress.  Afebrile, nontoxic, NAD  HENT:  Head: Normocephalic and atraumatic. Head is without raccoon's eyes, without Battle's sign, without abrasion and without contusion.    Mouth/Throat: Oropharynx is clear and moist and mucous membranes are normal.  Mild tenderness to the left temple, no crepitus or deformity, no bruising or abrasions, no bony step offs, no battle sign or raccoon eyes.   Eyes: Conjunctivae and EOM are normal. Right eye exhibits no discharge. Left eye exhibits no discharge. Right pupil is round and reactive. Left pupil is round and reactive. Pupils are unequal (? left pupil slightly larger than right ).  Pupils reactive and round bilaterally, but left pupil appears slightly larger than the right pupil, both accommodate and react equally with light exposure. EOMI, no nystagmus, no visual field deficits  Neck: Normal range of motion. Neck supple.  Cardiovascular: Normal rate, regular rhythm, normal heart sounds and intact distal pulses.  Exam reveals no gallop and no friction rub.   No murmur heard. Pulmonary/Chest: Effort normal and breath sounds normal. No respiratory distress. She has no decreased breath sounds. She has no wheezes. She has  no rhonchi. She has no rales.  Abdominal: Soft. Normal appearance and bowel sounds are normal. She exhibits no distension. There is no tenderness. There is no rigidity, no rebound, no guarding, no CVA tenderness, no tenderness at McBurney's point and negative Murphy's sign.  Musculoskeletal: Normal range of motion.  Neurological: She is alert and oriented to person, place, and time. She has normal strength. No cranial nerve deficit or sensory deficit. Coordination and gait normal. GCS eye subscore is 4. GCS verbal subscore is 5. GCS motor subscore is 6.  CN 2-12 grossly intact A&O x4 GCS 15 Sensation and strength intact Gait nonataxic including with tandem  walking Coordination with finger-to-nose WNL neg pronator drift  Skin: Skin is warm, dry and intact. No abrasion, no bruising and no rash noted.  Psychiatric: She has a normal mood and affect.  Nursing note and vitals reviewed.  ED Course  Procedures  DIAGNOSTIC STUDIES: Oxygen Saturation is 100% on RA, normal by my interpretation.    COORDINATION OF CARE: 4:28 PM Discussed next steps with pt and she agreed to plan.   Labs Review Labs Reviewed - No data to display  Imaging Review Ct Head Wo Contrast  10/13/2015  CLINICAL DATA:  Headache and nausea, wrestling head injury. Pressure and pain around left eye. EXAM: CT HEAD WITHOUT CONTRAST TECHNIQUE: Contiguous axial images were obtained from the base of the skull through the vertex without intravenous contrast. COMPARISON:  None. FINDINGS: Ventricles are normal in size and configuration. All areas of the brain demonstrate normal gray-white matter attenuation. There is no mass, hemorrhage, edema, or other evidence of acute parenchymal abnormality. No extra-axial hemorrhage. No osseous fracture or dislocation seen. Visualized upper paranasal sinuses are clear. Mastoid air cells are clear. Superficial soft tissues are unremarkable. IMPRESSION: Normal noncontrast head CT. Electronically Signed   By: Bary Richard M.D.   On: 10/13/2015 17:46   I have personally reviewed and evaluated these images as part of my medical decision-making.   EKG Interpretation None      MDM   Final diagnoses:  Acute post-traumatic headache, not intractable  Concussion, without loss of consciousness, initial encounter  Nausea    19 y.o. female here with HA/nausea after hitting her head yesterday against another person's head. No LOC. No focal neuro deficits on exam aside from L pupil being slightly larger than left which seems to correct when light shone in eye but seems to reoccur afterwards, at times pupils appear similar sizes, difficult to see if this is  truly a difference or not but given that pt was sent here from UC for ct scan, and that this pupil difference is present, will proceed with CT imaging to r/o bleed. Will give pain meds and nausea meds. Will reassess shortly.   5:57 PM Head CT neg. Pt declined all medications here, but feels fine and declines wanting anything before leaving. After further chart review, it seems pt had L eye surgery as a child, doesn't know details, but this could be the reason her pupils appear slightly unequal. Likely just concussion, discussed mental rest, ice use, and tylenol/naprosyn. Will send home with zofran as well. F/up with PCP in 3-4 days for recheck. I explained the diagnosis and have given explicit precautions to return to the ER including for any other new or worsening symptoms. The patient understands and accepts the medical plan as it's been dictated and I have answered their questions. Discharge instructions concerning home care and prescriptions have been given. The patient  is STABLE and is discharged to home in good condition.   I personally performed the services described in this documentation, which was scribed in my presence. The recorded information has been reviewed and is accurate.    BP 116/74 mmHg  Pulse 96  Temp(Src) 98.2 F (36.8 C) (Temporal)  Resp 18  SpO2 100%  LMP 10/08/2015 (Exact Date)  Meds ordered this encounter  Medications  . ondansetron (ZOFRAN-ODT) disintegrating tablet 8 mg    Sig:   . HYDROcodone-acetaminophen (NORCO/VICODIN) 5-325 MG per tablet 1 tablet    Sig:   . ondansetron (ZOFRAN) 8 MG tablet    Sig: Take 1 tablet (8 mg total) by mouth every 8 (eight) hours as needed for nausea or vomiting.    Dispense:  10 tablet    Refill:  0    Order Specific Question:  Supervising Provider    Answer:  Hyacinth Meeker, BRIAN [[id number]]  . naproxen (NAPROSYN) 500 MG tablet    Sig: Take 1 tablet (500 mg total) by mouth 2 (two) times daily as needed for mild pain, moderate pain or  headache (TAKE WITH MEALS.).    Dispense:  20 tablet    Refill:  0    Order Specific Question:  Supervising Provider    Answer:  Eber Hong [address] Camprubi-Soms, PA-C 10/13/15 1759  Zadie Rhine, MD 10/14/15 1134

## 2015-10-13 NOTE — ED Notes (Signed)
Patient was alert, oriented and stable upon discharge. RN went over AVS and patient had no further questions.  

## 2015-10-13 NOTE — ED Notes (Signed)
Patient c/o HA and nausea.  States wrestling around with a friend and they bumped heads.  No LOC and patient is able to recall the event.  Patient states that feels a pressure and pain around her left eye. Patient states her pain 3/10.  Breathing even and unlabored NAD at this time.

## 2015-10-13 NOTE — Progress Notes (Signed)
Alison Stephenson, Alison Stephenson Schedule an appointment as soon as possible for a visit This is your assigned Medicaid [place] access doctor If you prefer another contact DSS [phone] [place] [id number] [phone] Medicaid [place] Access Covered Patient [place] [facility] [facility] assists you to your dr appointments: [phone] or [phone] Transportation Supervisor [phone] DSS assigned your doctor *You may receive a bill if you go to any family Dr not assigned to you As a Medicaid client you MUST contact DSS/SSI each time you change address, move to another county or another state to keep your address updated  [place]: [place]: [phone] (main) [email or web address] [address] [place], [place] [address]

## 2015-10-13 NOTE — Discharge Instructions (Signed)
Use naprosyn or Tylenol for pain. Get plenty of rest, use ice on your head. Use zofran as needed for nausea. Stay in a quiet, not simulating, dark environment. No TV, computer use, video games, or cell phone use until headache is resolved completely. No contact sports until cleared by your regular doctor. Follow Up with primary care physician in 3-4 days if headache persists.  Return to the emergency department if patient becomes lethargic, begins vomiting or other change in mental status.    Concussion, Adult A concussion is a brain injury. It is caused by:  A hit to the head.  A quick and sudden movement (jolt) of the head or neck. A concussion is usually not life threatening. Even so, it can cause serious problems. If you had a concussion before, you may have concussion-like problems after a hit to your head. HOME CARE General Instructions  Follow your doctor's directions carefully.  Take medicines only as told by your doctor.  Only take medicines your doctor says are safe.  Do not drink alcohol until your doctor says it is okay. Alcohol and some drugs can slow down healing. They can also put you at risk for further injury.  If you are having trouble remembering things, write them down.  Try to do one thing at a time if you get distracted easily. For example, do not watch TV while making dinner.  Talk to your family members or close friends when making important decisions.  Follow up with your doctor as told.  Watch your symptoms. Tell others to do the same. Serious problems can sometimes happen after a concussion. Older adults are more likely to have these problems.  Tell your teachers, school nurse, school counselor, coach, Event organiserathletic trainer, or work Production designer, theatre/television/filmmanager about your concussion. Tell them about what you can or cannot do. They should watch to see if:  It gets even harder for you to pay attention or concentrate.  It gets even harder for you to remember things or learn new  things.  You need more time than normal to finish things.  You become annoyed (irritable) more than before.  You are not able to deal with stress as well.  You have more problems than before.  Rest. Make sure you:  Get plenty of sleep at night.  Go to sleep early.  Go to bed at the same time every day. Try to wake up at the same time.  Rest during the day.  Take naps when you feel tired.  Limit activities where you have to think a lot or concentrate. These include:  Doing homework.  Doing work related to a job.  Watching TV.  Using the computer. Returning To Your Regular Activities Return to your normal activities slowly, not all at once. You must give your body and brain enough time to heal.   Do not play sports or do other athletic activities until your doctor says it is okay.  Ask your doctor when you can drive, ride a bicycle, or work other vehicles or machines. Never do these things if you feel dizzy.  Ask your doctor about when you can return to work or school. Preventing Another Concussion It is very important to avoid another brain injury, especially before you have healed. In rare cases, another injury can lead to permanent brain damage, brain swelling, or death. The risk of this is greatest during the first 7-10 days after your injury. Avoid injuries by:   Wearing a seat belt when riding in  a car.  Not drinking too much alcohol.  Avoiding activities that could lead to a second concussion (such as contact sports).  Wearing a helmet when doing activities like:  Biking.  Skiing.  Skateboarding.  Skating.  Making your home safer by:  Removing things from the floor or stairways that could make you trip.  Using grab bars in bathrooms and handrails by stairs.  Placing non-slip mats on floors and in bathtubs.  Improve lighting in dark areas. GET HELP IF:  It gets even harder for you to pay attention or concentrate.  It gets even harder for you  to remember things or learn new things.  You need more time than normal to finish things.  You become annoyed (irritable) more than before.  You are not able to deal with stress as well.  You have more problems than before.  You have problems keeping your balance.  You are not able to react quickly when you should. Get help if you have any of these problems for more than 2 weeks:   Lasting (chronic) headaches.  Dizziness or trouble balancing.  Feeling sick to your stomach (nausea).  Seeing (vision) problems.  Being affected by noises or light more than normal.  Feeling sad, low, down in the dumps, blue, gloomy, or empty (depressed).  Mood changes (mood swings).  Feeling of fear or nervousness about what may happen (anxiety).  Feeling annoyed.  Memory problems.  Problems concentrating or paying attention.  Sleep problems.  Feeling tired all the time. GET HELP RIGHT AWAY IF:   You have bad headaches or your headaches get worse.  You have weakness (even if it is in one hand, leg, or part of the face).  You have loss of feeling (numbness).  You feel off balance.  You keep throwing up (vomiting).  You feel tired.  One black center of your eye (pupil) is larger than the other.  You twitch or shake violently (convulse).  Your speech is not clear (slurred).  You are more confused, easily angered (agitated), or annoyed than before.  You have more trouble resting than before.  You are unable to recognize people or places.  You have neck pain.  It is difficult to wake you up.  You have unusual behavior changes.  You pass out (lose consciousness). MAKE SURE YOU:   Understand these instructions.  Will watch your condition.  Will get help right away if you are not doing well or get worse.   This information is not intended to replace advice given to you by your health care provider. Make sure you discuss any questions you have with your health care  provider.   Document Released: 10/18/2009 Document Revised: 11/20/2014 Document Reviewed: 05/22/2013 Elsevier Interactive Patient Education 2016 Elsevier Inc[employer]  Head Injury, Adult You have received a head injury. It does not appear serious at this time. Headaches and vomiting are common following head injury. It should be easy to awaken from sleeping. Sometimes it is necessary for you to stay in the emergency department for a while for observation. Sometimes admission to the hospital may be needed. After injuries such as yours, most problems occur within the first 24 hours, but side effects may occur up to 7-10 days after the injury. It is important for you to carefully monitor your condition and contact your health care provider or seek immediate medical care if there is a change in your condition. WHAT ARE THE TYPES OF HEAD INJURIES? Head injuries can be as  minor as a bump. Some head injuries can be more severe. More severe head injuries include:  A jarring injury to the brain (concussion).  A bruise of the brain (contusion). This mean there is bleeding in the brain that can cause swelling.  A cracked skull (skull fracture).  Bleeding in the brain that collects, clots, and forms a bump (hematoma). WHAT CAUSES A HEAD INJURY? A serious head injury is most likely to happen to someone who is in a car wreck and is not wearing a seat belt. Other causes of major head injuries include bicycle or motorcycle accidents, sports injuries, and falls. HOW ARE HEAD INJURIES DIAGNOSED? A complete history of the event leading to the injury and your current symptoms will be helpful in diagnosing head injuries. Many times, pictures of the brain, such as CT or MRI are needed to see the extent of the injury. Often, an overnight hospital stay is necessary for observation.  WHEN SHOULD I SEEK IMMEDIATE MEDICAL CARE?  You should get help right away if:  You have confusion or drowsiness.  You feel sick to your  stomach (nauseous) or have continued, forceful vomiting.  You have dizziness or unsteadiness that is getting worse.  You have severe, continued headaches not relieved by medicine. Only take over-the-counter or prescription medicines for pain, fever, or discomfort as directed by your health care provider.  You do not have normal function of the arms or legs or are unable to walk.  You notice changes in the black spots in the center of the colored part of your eye (pupil).  You have a clear or bloody fluid coming from your nose or ears.  You have a loss of vision. During the next 24 hours after the injury, you must stay with someone who can watch you for the warning signs. This person should contact local emergency services (911 in the [place] if you have seizures, you become unconscious, or you are unable to wake up. HOW CAN I PREVENT A HEAD INJURY IN THE FUTURE? The most important factor for preventing major head injuries is avoiding motor vehicle accidents. To minimize the potential for damage to your head, it is crucial to wear seat belts while riding in motor vehicles. Wearing helmets while bike riding and playing collision sports (like football) is also helpful. Also, avoiding dangerous activities around the house will further help reduce your risk of head injury.  WHEN CAN I RETURN TO NORMAL ACTIVITIES AND ATHLETICS? You should be reevaluated by your health care provider before returning to these activities. If you have any of the following symptoms, you should not return to activities or contact sports until 1 week after the symptoms have stopped:  Persistent headache.  Dizziness or vertigo.  Poor attention and concentration.  Confusion.  Memory problems.  Nausea or vomiting.  Fatigue or tire easily.  Irritability.  Intolerant of bright lights or loud noises.  Anxiety or depression.  Disturbed sleep. MAKE SURE YOU:   Understand these instructions.  Will watch your  condition.  Will get help right away if you are not doing well or get worse.   This information is not intended to replace advice given to you by your health care provider. Make sure you discuss any questions you have with your health care provider.   Document Released: 10/30/2005 Document Revised: 11/20/2014 Document Reviewed: 07/07/2013 Elsevier Interactive Patient Education 2016 Elsevier Inc.  Nausea, Adult Nausea is the feeling that you have an upset stomach or have to vomit.  Nausea by itself is not likely a serious concern, but it may be an early sign of more serious medical problems. As nausea gets worse, it can lead to vomiting. If vomiting develops, there is the risk of dehydration.  CAUSES   Viral infections.  Food poisoning.  Medicines.  Pregnancy.  Motion sickness.  Migraine headaches.  Emotional distress.  Severe pain from any source.  Alcohol intoxication. HOME CARE INSTRUCTIONS  Get plenty of rest.  Ask your caregiver about specific rehydration instructions.  Eat small amounts of food and sip liquids more often.  Take all medicines as told by your caregiver. SEEK MEDICAL CARE IF:  You have not improved after 2 days, or you get worse.  You have a headache. SEEK IMMEDIATE MEDICAL CARE IF:   You have a fever.  You faint.  You keep vomiting or have blood in your vomit.  You are extremely weak or dehydrated.  You have dark or bloody stools.  You have severe chest or abdominal pain. MAKE SURE YOU:  Understand these instructions.  Will watch your condition.  Will get help right away if you are not doing well or get worse.   This information is not intended to replace advice given to you by your health care provider. Make sure you discuss any questions you have with your health care provider.   Document Released: 12/07/2004 Document Revised: 11/20/2014 Document Reviewed: 07/12/2011 Elsevier Interactive Patient Education [employer].

## 2016-03-06 ENCOUNTER — Emergency Department (HOSPITAL_COMMUNITY)
Admission: EM | Admit: 2016-03-06 | Discharge: 2016-03-06 | Disposition: A | Discharge status: Home / Self Care | Payer: Medicaid Other | Attending: Emergency Medicine | Admitting: Emergency Medicine

## 2016-03-06 ENCOUNTER — Emergency Department (HOSPITAL_COMMUNITY): Payer: Medicaid Other

## 2016-03-06 ENCOUNTER — Encounter (HOSPITAL_COMMUNITY): Payer: Self-pay | Admitting: Emergency Medicine

## 2016-03-06 DIAGNOSIS — S0990XA Unspecified injury of head, initial encounter: Secondary | ICD-10-CM | POA: Insufficient documentation

## 2016-03-06 DIAGNOSIS — Z91040 Latex allergy status: Secondary | ICD-10-CM | POA: Diagnosis not present

## 2016-03-06 DIAGNOSIS — R51 Headache: Secondary | ICD-10-CM | POA: Diagnosis present

## 2016-03-06 DIAGNOSIS — Z7951 Long term (current) use of inhaled steroids: Secondary | ICD-10-CM | POA: Diagnosis not present

## 2016-03-06 DIAGNOSIS — Y939 Activity, unspecified: Secondary | ICD-10-CM | POA: Insufficient documentation

## 2016-03-06 DIAGNOSIS — Y999 Unspecified external cause status: Secondary | ICD-10-CM | POA: Diagnosis not present

## 2016-03-06 DIAGNOSIS — F419 Anxiety disorder, unspecified: Secondary | ICD-10-CM | POA: Insufficient documentation

## 2016-03-06 DIAGNOSIS — Z791 Long term (current) use of non-steroidal anti-inflammatories (NSAID): Secondary | ICD-10-CM | POA: Diagnosis not present

## 2016-03-06 DIAGNOSIS — Z79891 Long term (current) use of opiate analgesic: Secondary | ICD-10-CM | POA: Diagnosis not present

## 2016-03-06 DIAGNOSIS — Z79899 Other long term (current) drug therapy: Secondary | ICD-10-CM | POA: Insufficient documentation

## 2016-03-06 DIAGNOSIS — J45909 Unspecified asthma, uncomplicated: Secondary | ICD-10-CM | POA: Insufficient documentation

## 2016-03-06 DIAGNOSIS — Y92410 Unspecified street and highway as the place of occurrence of the external cause: Secondary | ICD-10-CM | POA: Diagnosis not present

## 2016-03-06 MED ORDER — IBUPROFEN 800 MG PO TABS: 800.0000 mg | ORAL_TABLET | Freq: Three times a day (TID) | ORAL | Status: DC

## 2016-03-06 MED ORDER — CYCLOBENZAPRINE HCL 10 MG PO TABS: 10.0000 mg | ORAL_TABLET | Freq: Two times a day (BID) | ORAL | Status: DC | PRN

## 2016-03-06 MED ORDER — IBUPROFEN 800 MG PO TABS
800.0000 mg | ORAL_TABLET | Freq: Once | ORAL | Status: AC
  Administered 2016-03-06: 800 mg via ORAL
  Filled 2016-03-06: qty 1
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
## 2016-03-06 NOTE — Discharge Instructions (Signed)
Take your medications as prescribed. I recommend eating prior taking your ibuprofens prevent gastritis all side effects. I also recommend applying ice to area of swelling for 15-20 minutes 3-4 times daily. Follow-up with your primary care provider in one week as needed if your symptoms have not improved. Please return to the Emergency Department if symptoms worsen or new onset of fever, neck stiffness, visual changes, lightheadedness, dizziness, numbness, tingling, weakness, confusion, vomiting, change in behavior.

## 2016-06-14 ENCOUNTER — Ambulatory Visit: Payer: Medicaid Other | Admitting: Obstetrics

## 2016-07-05 ENCOUNTER — Ambulatory Visit: Payer: Medicaid Other | Admitting: Obstetrics

## 2016-07-06 ENCOUNTER — Ambulatory Visit: Payer: Self-pay | Admitting: Obstetrics and Gynecology

## 2016-08-07 ENCOUNTER — Ambulatory Visit: Payer: Self-pay | Admitting: Obstetrics and Gynecology

## 2017-03-12 ENCOUNTER — Ambulatory Visit (INDEPENDENT_AMBULATORY_CARE_PROVIDER_SITE_OTHER): Payer: Medicaid Other | Admitting: Obstetrics

## 2017-03-12 ENCOUNTER — Encounter: Payer: Self-pay | Admitting: Obstetrics

## 2017-03-12 ENCOUNTER — Other Ambulatory Visit (HOSPITAL_COMMUNITY)
Admission: RE | Admit: 2017-03-12 | Discharge: 2017-03-12 | Disposition: A | Discharge status: Home / Self Care | Payer: Medicaid Other | Source: Ambulatory Visit | Attending: Obstetrics | Admitting: Obstetrics

## 2017-03-12 VITALS — BP 116/71 | HR 78 | Ht 64.0 in | Wt 123.0 lb

## 2017-03-12 DIAGNOSIS — Z01419 Encounter for gynecological examination (general) (routine) without abnormal findings: Secondary | ICD-10-CM | POA: Insufficient documentation

## 2017-03-12 DIAGNOSIS — Z124 Encounter for screening for malignant neoplasm of cervix: Secondary | ICD-10-CM

## 2017-03-12 DIAGNOSIS — Z308 Encounter for other contraceptive management: Secondary | ICD-10-CM

## 2017-03-12 DIAGNOSIS — Z3009 Encounter for other general counseling and advice on contraception: Secondary | ICD-10-CM

## 2017-03-12 DIAGNOSIS — F419 Anxiety disorder, unspecified: Secondary | ICD-10-CM | POA: Insufficient documentation

## 2017-03-12 DIAGNOSIS — J45909 Unspecified asthma, uncomplicated: Secondary | ICD-10-CM | POA: Insufficient documentation

## 2017-03-12 NOTE — Progress Notes (Signed)
Subjective:        Alison Stephenson is a 21 y.o. female here for a routine exam.  Current complaints: None.    Personal health questionnaire:  Is patient Ashkenazi Jewish, have a family history of breast and/or ovarian cancer: no Is there a family history of uterine cancer diagnosed at age < 8, gastrointestinal cancer, urinary tract cancer, family member who is a Personnel officer syndrome-associated carrier: no Is the patient overweight and hypertensive, family history of diabetes, personal history of gestational diabetes, preeclampsia or PCOS: no Is patient over 45, have PCOS,  family history of premature CHD under age 33, diabetes, smoke, have hypertension or peripheral artery disease:  no At any time, has a partner hit, kicked or otherwise hurt or frightened you?: no Over the past 2 weeks, have you felt down, depressed or hopeless?: no Over the past 2 weeks, have you felt little interest or pleasure in doing things?:no   Gynecologic History Patient's last menstrual period was 03/04/2017. Contraception: none Last Pap: none. Results were: none Last mammogram: n/a. Results were: n/a  Obstetric History OB History  Gravida Para Term Preterm AB Living  0            SAB TAB Ectopic Multiple Live Births                   Past Medical History:  Diagnosis Date  . Anxiety    seeing provider  . Asthma     Past Surgical History:  Procedure Laterality Date  . 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 MOUTH SURGERY       Current Outpatient Prescriptions:  .  albuterol (PROAIR HFA) 108 (90 Base) MCG/ACT inhaler, Inhale 2 puffs into the lungs every 4 (four) hours as needed., Disp: , Rfl:  .  Biotin 1 MG CAPS, Take 1 mg by mouth daily., Disp: , Rfl:  .  Biotin w/ Vitamins C & E (HAIR/SKIN/NAILS PO), Take 1 tablet by mouth daily., Disp: , Rfl:  .  cholecalciferol (VITAMIN D) 1000 units tablet, Take 1,000 Units by mouth daily., Disp: , Rfl:  .  Multiple  Vitamins-Minerals (MULTIVITAMIN ADULT PO), Take 1 tablet by mouth daily., Disp: , Rfl:  .  omega-3 acid ethyl esters (LOVAZA) 1 G capsule, Take 1 g by mouth daily., Disp: , Rfl:  .  vitamin C (ASCORBIC ACID) 500 MG tablet, Take 500 mg by mouth daily., Disp: , Rfl:  Allergies  Allergen Reactions  . Latex Other (See Comments)    Irritation and redness    Social History  Substance Use Topics  . Smoking status: Never Smoker  . Smokeless tobacco: Never Used  . Alcohol use No    Family History  Problem Relation Age of Onset  . Arthritis Mother   . Mental illness Mother   . Cancer Maternal Grandmother   . Heart attack Paternal Grandmother       Review of Systems  Constitutional: negative for fatigue and weight loss Respiratory: negative for cough and wheezing Cardiovascular: negative for chest pain, fatigue and palpitations Gastrointestinal: negative for abdominal pain and change in bowel habits Musculoskeletal:negative for myalgias Neurological: negative for gait problems and tremors Behavioral/Psych: negative for abusive relationship, depression Endocrine: negative for temperature intolerance    Genitourinary:negative for abnormal menstrual periods, genital lesions, hot flashes, sexual problems and vaginal discharge Integument/breast: negative for breast lump, breast tenderness, nipple discharge and skin lesion(s)    Objective:  BP 116/71   Pulse 78   Ht  (1.626 m)   Wt 123 lb (55.8 kg)   LMP 03/04/2017   BMI 21.11 kg/m  General:   alert  Skin:   no rash or abnormalities  Lungs:   clear to auscultation bilaterally  Heart:   regular rate and rhythm, S1, S2 normal, no murmur, click, rub or gallop  Breasts:   normal without suspicious masses, skin or nipple changes or axillary nodes  Abdomen:  normal findings: no organomegaly, soft, non-tender and no hernia  Pelvis:  External genitalia: normal general appearance Urinary system: urethral meatus normal and  bladder without fullness, nontender Vaginal: normal without tenderness, induration or masses Cervix: normal appearance Adnexa: normal bimanual exam Uterus: anteverted and non-tender, normal size   Lab Review Urine pregnancy test Labs reviewed yes Radiologic studies reviewed no  50% of 20 min visit spent on counseling and coordination of care.    Assessment:    Healthy female exam.    Contraceptive Counseling and Advice.  Declines contraception.   Plan:    Education reviewed: calcium supplements, depression evaluation, low fat, low cholesterol diet, safe sex/STD prevention, self breast exams, skin cancer screening and weight bearing exercise. Contraception: none. Follow up in: 1 year.   No orders of the defined types were placed in this encounter.  No orders of the defined types were placed in this encounter.     Patient ID: Alison Stephenson, female   DOB: Aug 10, 1996, 70 y.o.   MRN: [id number] Patient ID: Alison Stephenson, female   DOB: 1996-05-20, 74 y.o.   MRN: [id number]

## 2017-03-12 NOTE — Progress Notes (Signed)
Subjective:        Alison Stephenson is a 21 y.o. female here for a routine exam.  Current complaints: None.    Personal health questionnaire:  Is patient Ashkenazi Jewish, have a family history of breast and/or ovarian cancer: no Is there a family history of uterine cancer diagnosed at age < 46, gastrointestinal cancer, urinary tract cancer, family member who is a Personnel officer syndrome-associated carrier: no Is the patient overweight and hypertensive, family history of diabetes, personal history of gestational diabetes, preeclampsia or PCOS: no Is patient over 40, have PCOS,  family history of premature CHD under age 40, diabetes, smoke, have hypertension or peripheral artery disease:  no At any time, has a partner hit, kicked or otherwise hurt or frightened you?: no Over the past 2 weeks, have you felt down, depressed or hopeless?: no Over the past 2 weeks, have you felt little interest or pleasure in doing things?:no   Gynecologic History Patient's last menstrual period was 03/04/2017. Contraception: none Last Pap: none. Results were: n/a Last mammogram: none. Results were: n/a  Obstetric History OB History  Gravida Para Term Preterm AB Living  0            SAB TAB Ectopic Multiple Live Births                   Past Medical History:  Diagnosis Date  . Anxiety    seeing provider  . Asthma     Past Surgical History:  Procedure Laterality Date  . 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 MOUTH SURGERY       Current Outpatient Prescriptions:  .  albuterol (PROAIR HFA) 108 (90 Base) MCG/ACT inhaler, Inhale 2 puffs into the lungs every 4 (four) hours as needed., Disp: , Rfl:  .  Biotin 1 MG CAPS, Take 1 mg by mouth daily., Disp: , Rfl:  .  Biotin w/ Vitamins C & E (HAIR/SKIN/NAILS PO), Take 1 tablet by mouth daily., Disp: , Rfl:  .  cholecalciferol (VITAMIN D) 1000 units tablet, Take 1,000 Units by mouth daily., Disp: , Rfl:  .  Multiple  Vitamins-Minerals (MULTIVITAMIN ADULT PO), Take 1 tablet by mouth daily., Disp: , Rfl:  .  omega-3 acid ethyl esters (LOVAZA) 1 G capsule, Take 1 g by mouth daily., Disp: , Rfl:  .  vitamin C (ASCORBIC ACID) 500 MG tablet, Take 500 mg by mouth daily., Disp: , Rfl:  Allergies  Allergen Reactions  . Latex Other (See Comments)    Irritation and redness    Social History  Substance Use Topics  . Smoking status: Never Smoker  . Smokeless tobacco: Never Used  . Alcohol use No    Family History  Problem Relation Age of Onset  . Arthritis Mother   . Mental illness Mother   . Cancer Maternal Grandmother   . Heart attack Paternal Grandmother       Review of Systems  Constitutional: negative for fatigue and weight loss Respiratory: negative for cough and wheezing Cardiovascular: negative for chest pain, fatigue and palpitations Gastrointestinal: negative for abdominal pain and change in bowel habits Musculoskeletal:negative for myalgias Neurological: negative for gait problems and tremors Behavioral/Psych: negative for abusive relationship, depression Endocrine: negative for temperature intolerance    Genitourinary:negative for abnormal menstrual periods, genital lesions, hot flashes, sexual problems and vaginal discharge Integument/breast: negative for breast lump, breast tenderness, nipple discharge and skin lesion(s)    Objective:  LMP 03/04/2017  General:   alert  Skin:   no rash or abnormalities  Lungs:   clear to auscultation bilaterally  Heart:   regular rate and rhythm, S1, S2 normal, no murmur, click, rub or gallop  Breasts:   normal without suspicious masses, skin or nipple changes or axillary nodes  Abdomen:  normal findings: no organomegaly, soft, non-tender and no hernia  Pelvis:  External genitalia: normal general appearance Urinary system: urethral meatus normal and bladder without fullness, nontender Vaginal: normal without tenderness, induration or  masses Cervix: normal appearance Adnexa: normal bimanual exam Uterus: anteverted and non-tender, normal size   Lab Review Urine pregnancy test Labs reviewed yes Radiologic studies reviewed no  50% of 20 min visit spent on counseling and coordination of care.    Assessment:    Healthy female exam.    Contraceptive Counseling and Advice.  Declines contraception.   Plan:    Education reviewed: calcium supplements, depression evaluation, low fat, low cholesterol diet, safe sex/STD prevention, self breast exams, skin cancer screening and weight bearing exercise. Contraception: none. Follow up in: 1 year.   No orders of the defined types were placed in this encounter.  No orders of the defined types were placed in this encounter.

## 2017-03-13 LAB — CYTOLOGY - PAP: DIAGNOSIS: NEGATIVE
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
## 2017-11-08 ENCOUNTER — Encounter (HOSPITAL_BASED_OUTPATIENT_CLINIC_OR_DEPARTMENT_OTHER): Payer: Self-pay | Admitting: Emergency Medicine

## 2017-11-08 ENCOUNTER — Emergency Department (HOSPITAL_BASED_OUTPATIENT_CLINIC_OR_DEPARTMENT_OTHER)
Admission: EM | Admit: 2017-11-08 | Discharge: 2017-11-08 | Disposition: A | Discharge status: Home / Self Care | Payer: Self-pay | Attending: Physician Assistant | Admitting: Physician Assistant

## 2017-11-08 ENCOUNTER — Other Ambulatory Visit: Payer: Self-pay

## 2017-11-08 DIAGNOSIS — B9689 Other specified bacterial agents as the cause of diseases classified elsewhere: Secondary | ICD-10-CM | POA: Insufficient documentation

## 2017-11-08 DIAGNOSIS — N760 Acute vaginitis: Secondary | ICD-10-CM | POA: Insufficient documentation

## 2017-11-08 DIAGNOSIS — J45909 Unspecified asthma, uncomplicated: Secondary | ICD-10-CM | POA: Insufficient documentation

## 2017-11-08 DIAGNOSIS — Z79899 Other long term (current) drug therapy: Secondary | ICD-10-CM | POA: Insufficient documentation

## 2017-11-08 DIAGNOSIS — R0981 Nasal congestion: Secondary | ICD-10-CM | POA: Insufficient documentation

## 2017-11-08 LAB — URINALYSIS, ROUTINE W REFLEX MICROSCOPIC
BILIRUBIN URINE: NEGATIVE
GLUCOSE, UA: NEGATIVE mg/dL
HGB URINE DIPSTICK: NEGATIVE
KETONES UR: NEGATIVE mg/dL
Leukocytes, UA: NEGATIVE
Nitrite: NEGATIVE
PROTEIN: NEGATIVE mg/dL
Specific Gravity, Urine: 1.02 (ref 1.005–1.030)
pH: 6.5 (ref 5.0–8.0)

## 2017-11-08 LAB — WET PREP, GENITAL
SPERM: NONE SEEN
Trich, Wet Prep: NONE SEEN
YEAST WET PREP: NONE SEEN

## 2017-11-08 LAB — PREGNANCY, URINE: Preg Test, Ur: NEGATIVE

## 2017-11-08 MED ORDER — METRONIDAZOLE 500 MG PO TABS: 500.0000 mg | ORAL_TABLET | Freq: Two times a day (BID) | ORAL | 0 refills | Status: AC

## 2017-11-08 NOTE — ED Notes (Signed)
Patient reports vaginal discharge, LLQ abdominal pain.  Reports no sexual activity at present but reports she has felt nauseated recently and felt like she had a fever.

## 2017-11-08 NOTE — ED Triage Notes (Signed)
Pt reports abd cramping and odorous urine x 1 month, worsening over the past few days. Also reports URI symptoms since this morning.

## 2017-11-08 NOTE — Discharge Instructions (Signed)
You have something called bacterial vaginosis.  This may be causing the cramping.  You should take metronidazole.  Please not drink with this medication.  As for your upper respiratory symptoms, please use a Nettie pot, ibuprofen Tylenol.

## 2017-11-08 NOTE — ED Provider Notes (Signed)
[facility] EMERGENCY DEPARTMENT Provider Note   CSN: [id number] Arrival date & time: 11/08/17  1159     History   Chief Complaint Chief Complaint  Patient presents with  . Abdominal Pain  . Nasal Congestion    HPI Alison Stephenson is a 21 y.o. female.  HPI   Patient is a 21 year old female presenting with several different complaints.  Primarily she says she is here because she "no she has a kidney infection".  She reports that she knoiws this because she occasionally has cramping.  She feels like she "knows her body".  But has been feeling that in the last couple weeks she has been having occasional cramping between her periods.  This is abnormal for her.  Patient has no burning with urination.  She reports that she has a mild discharge.  Is not malodorous.  Patient also here because she has an upper respiratory infection.  She reports she developed a mild cough today with congestion.  No fevers.  Eating and drinking normally.  Past Medical History:  Diagnosis Date  . Anxiety    seeing provider  . Asthma     Patient Active Problem List   Diagnosis Date Noted  . BV (bacterial vaginosis) 10/20/2014  . Chronic cervicitis 10/20/2014  . Yeast infection 12/23/2013  . Vaginitis and vulvovaginitis, unspecified 08/20/2013  . Vulvar irritation 08/20/2013  . Well woman exam with routine gynecological exam 06/24/2013  . IUD (intrauterine device) in place 06/24/2013  . Vaginitis 06/24/2013    Past Surgical History:  Procedure Laterality Date  . 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 MOUTH SURGERY      OB History    Gravida Para Term Preterm AB Living   0             SAB TAB Ectopic Multiple Live Births                   Home Medications    Prior to Admission medications   Medication Sig Start Date End Date Taking? Authorizing Provider  albuterol (PROAIR HFA) 108 (90 Base) MCG/ACT inhaler Inhale 2 puffs into the lungs every 4  (four) hours as needed. 07/22/15   [provider]  Biotin 1 MG CAPS Take 1 mg by mouth daily.    [provider]  Biotin w/ Vitamins C & E (HAIR/SKIN/NAILS PO) Take 1 tablet by mouth daily.    [provider]  cholecalciferol (VITAMIN D) 1000 units tablet Take 1,000 Units by mouth daily.    [provider]  Multiple Vitamins-Minerals (MULTIVITAMIN ADULT PO) Take 1 tablet by mouth daily.    [provider]  omega-3 acid ethyl esters (LOVAZA) 1 G capsule Take 1 g by mouth daily.    [provider]  vitamin C (ASCORBIC ACID) 500 MG tablet Take 500 mg by mouth daily.    [provider]    Family History Family History  Problem Relation Age of Onset  . Arthritis Mother   . Mental illness Mother   . Cancer Maternal Grandmother   . Heart attack Paternal Grandmother     Social History Social History   Tobacco Use  . Smoking status: Never Smoker  . Smokeless tobacco: Never Used  Substance Use Topics  . Alcohol use: No    Alcohol/week: 0.0 oz  . Drug use: No     Allergies   Latex   Review of  Systems Review of Systems  Constitutional: Negative for activity change.  Respiratory: Negative for shortness of breath.   Cardiovascular: Negative for chest pain.  Gastrointestinal: Negative for abdominal pain.  Genitourinary: Positive for pelvic pain and vaginal discharge. Negative for difficulty urinating, dysuria and vaginal pain.  All other systems reviewed and are negative.    Physical Exam Updated Vital Signs BP 115/78 (BP Location: Right Arm)   Pulse (!) 104   Temp 99.3 F (37.4 C) (Oral)   Resp 18   Ht 5\' 5"  (1.651 m)   Wt 56.7 kg (125 lb)   LMP 10/15/2017   SpO2 100%   BMI 20.80 kg/m   Physical Exam  Constitutional: She is oriented to person, place, and time. She appears well-developed and well-nourished.  HENT:  Head: Normocephalic and atraumatic.  Eyes: Right eye exhibits no discharge.  Cardiovascular:  Normal rate, regular rhythm and normal heart sounds.  No murmur heard. Pulmonary/Chest: Effort normal and breath sounds normal. She has no wheezes. She has no rales.  Abdominal: Soft. She exhibits no distension. There is no tenderness.  Genitourinary: Cervix exhibits no motion tenderness and no friability. Right adnexum displays no mass. Left adnexum displays no mass. Vaginal discharge found.  Genitourinary Comments: No pain  Neurological: She is oriented to person, place, and time.  Skin: Skin is warm and dry. She is not diaphoretic.  Psychiatric: She has a normal mood and affect.  Nursing note and vitals reviewed.    ED Treatments / Results  Labs (all labs ordered are listed, but only abnormal results are displayed) Labs Reviewed  WET PREP, GENITAL  URINALYSIS, ROUTINE W REFLEX MICROSCOPIC  PREGNANCY, URINE  GC/CHLAMYDIA PROBE AMP ([place]    EKG  EKG Interpretation None       Radiology No results found.  Procedures Procedures (including critical care time)  Medications Ordered in ED Medications - No data to display   Initial Impression / Assessment and Plan / ED Course  I have reviewed the triage vital signs and the nursing notes.  Pertinent labs & imaging results that were available during my care of the patient were reviewed by me and considered in my medical decision making (see chart for details).    Patient is a 21 year old female presenting with several different complaints.  Primarily she says she is here because she "no she has a kidney infection".  She reports that she knoiws this because she occasionally has cramping.  She feels like she "knows her body".  But has been feeling that in the last couple weeks she has been having occasional cramping between her periods.  This is abnormal for her.  Patient has no burning with urination.  She reports that she has a mild discharge.  Is not malodorous.  Patient also here because she has an upper  respiratory infection.  She reports she developed a mild cough today with congestion.  No fevers.  Eating and drinking normally.  3:31 PM Patient has normal lung sounds.  For URI we encouraged Nettie pot, ibuprofen and Tylenol.  Her occasional cramping, her urine does not show any signs of infection.  We did vaginal exam which showed no CMT.  + Clue cells, will treat for BV  Final Clinical Impressions(s) / ED Diagnoses   Final diagnoses:  None    ED Discharge Orders    None       Mackuen, Cindee Saltourteney Lyn, MD 11/08/17 1608

## 2017-11-08 NOTE — ED Notes (Signed)
ED Provider at bedside. 

## 2017-11-09 LAB — GC/CHLAMYDIA PROBE AMP (~~LOC~~) NOT AT ARMC
Chlamydia: NEGATIVE
Neisseria Gonorrhea: NEGATIVE
